# Patient Record
Sex: Male | Born: 1985 | Hispanic: Yes | Marital: Single | Smoking: Never smoker
Health system: Southern US, Community
[De-identification: ages and names within clinical notes are randomized; demographics above are authoritative.]

---

## 2017-03-01 ENCOUNTER — Emergency Department: Payer: Self-pay

## 2017-03-01 ENCOUNTER — Other Ambulatory Visit: Payer: Self-pay

## 2017-03-01 ENCOUNTER — Emergency Department
Admission: EM | Admit: 2017-03-01 | Discharge: 2017-03-01 | Disposition: A | Discharge status: Home / Self Care | Payer: Self-pay | Attending: Emergency Medicine | Admitting: Emergency Medicine

## 2017-03-01 DIAGNOSIS — Y999 Unspecified external cause status: Secondary | ICD-10-CM | POA: Insufficient documentation

## 2017-03-01 DIAGNOSIS — F10920 Alcohol use, unspecified with intoxication, uncomplicated: Secondary | ICD-10-CM

## 2017-03-01 DIAGNOSIS — Y906 Blood alcohol level of 120-199 mg/100 ml: Secondary | ICD-10-CM | POA: Insufficient documentation

## 2017-03-01 DIAGNOSIS — Y929 Unspecified place or not applicable: Secondary | ICD-10-CM | POA: Insufficient documentation

## 2017-03-01 DIAGNOSIS — S0011XA Contusion of right eyelid and periocular area, initial encounter: Secondary | ICD-10-CM | POA: Insufficient documentation

## 2017-03-01 DIAGNOSIS — Y939 Activity, unspecified: Secondary | ICD-10-CM | POA: Insufficient documentation

## 2017-03-01 DIAGNOSIS — F10120 Alcohol abuse with intoxication, uncomplicated: Secondary | ICD-10-CM | POA: Insufficient documentation

## 2017-03-01 DIAGNOSIS — S0990XA Unspecified injury of head, initial encounter: Secondary | ICD-10-CM | POA: Insufficient documentation

## 2017-03-01 DIAGNOSIS — S42332A Displaced oblique fracture of shaft of humerus, left arm, initial encounter for closed fracture: Secondary | ICD-10-CM | POA: Insufficient documentation

## 2017-03-01 LAB — BASIC METABOLIC PANEL
Anion gap: 13 (ref 5–15)
BUN: 10 mg/dL (ref 6–20)
CHLORIDE: 107 mmol/L (ref 101–111)
CO2: 23 mmol/L (ref 22–32)
Calcium: 9.2 mg/dL (ref 8.9–10.3)
Creatinine, Ser: 0.85 mg/dL (ref 0.61–1.24)
GFR calc non Af Amer: >60 mL/min (ref >60)
Glucose, Bld: 142 mg/dL — ABNORMAL HIGH (ref 65–99)
POTASSIUM: 3.6 mmol/L (ref 3.5–5.1)
SODIUM: 143 mmol/L (ref 135–145)

## 2017-03-01 LAB — CBC
HEMATOCRIT: 48.6 % (ref 40.0–52.0)
HEMOGLOBIN: 16.9 g/dL (ref 13.0–18.0)
MCH: 30.9 pg (ref 26.0–34.0)
MCHC: 34.8 g/dL (ref 32.0–36.0)
MCV: 88.7 fL (ref 80.0–100.0)
Platelets: 278 10*3/uL (ref 150–440)
RBC: 5.48 MIL/uL (ref 4.40–5.90)
RDW: 12.7 % (ref 11.5–14.5)
WBC: 10.3 10*3/uL (ref 3.8–10.6)

## 2017-03-01 LAB — ETHANOL: Alcohol, Ethyl (B): 174 mg/dL — ABNORMAL HIGH (ref <10)

## 2017-03-01 MED ORDER — ONDANSETRON HCL 4 MG/2ML IJ SOLN
INTRAMUSCULAR | Status: AC
  Administered 2017-03-01: 4 mg
  Filled 2017-03-01: qty 2

## 2017-03-01 MED ORDER — HYDROMORPHONE HCL 1 MG/ML IJ SOLN
INTRAMUSCULAR | Status: AC
  Administered 2017-03-01: 1 mg
  Filled 2017-03-01: qty 1

## 2017-03-01 NOTE — ED Notes (Signed)
Patient transported to CT 
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
## 2017-03-01 NOTE — ED Notes (Addendum)
[facility]

## 2017-03-01 NOTE — ED Notes (Signed)
Hospital interpreter used to advise pt of discharge instructions - pt also instructed that he needed to call a sober ride to pick him up and that this nurse must see the ride for him to be discharged - pt states that his phone is died and that all the numbers to call are in the phone - allowed pt to borrow this nurses personal charger to charge phone in order to obtain contact to take the pt home - will allow phone to charge 15 min and return to attempt and call ride
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
## 2017-03-01 NOTE — ED Notes (Signed)
Pt speaks little english. Pt placed on 2L O2 due to pain medication.

## 2017-03-01 NOTE — ED Notes (Signed)
Called for interpreter to review instructions for discharge again because pt does not recall the conversation and I am unable to get him to understand his follow up information

## 2017-03-01 NOTE — ED Notes (Signed)
Pt states that he has family coming to pick him up

## 2017-03-01 NOTE — ED Notes (Signed)
Pt assisted to toilet to void 
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
## 2017-03-01 NOTE — ED Notes (Signed)
Due to pt not being able to find a ride home - spoke to Dr Stafford andScotty Court he stated that pt was sober enough to stay in the waiting room and call for a ride home - pt will be safely discharged per Dr Scotty CourtStafford opinion

## 2017-03-01 NOTE — ED Notes (Signed)
Pt is texting people now trying to find a ride home

## 2017-03-01 NOTE — ED Notes (Signed)
Pt still attempting to call for ride home

## 2017-03-01 NOTE — Discharge Instructions (Addendum)
Please follow up with ortho. Return to the ER if you have severe pain in the lower arm or your hand turns pale and cold.
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
